# Patient Record
Sex: Female | Born: 1960 | Race: White | Hispanic: No | Smoking: Never smoker
Health system: Southern US, Community
[De-identification: ages and names within clinical notes are randomized; demographics above are authoritative.]

## PROBLEM LIST (undated history)

## (undated) DIAGNOSIS — C801 Malignant (primary) neoplasm, unspecified: Secondary | ICD-10-CM

## (undated) DIAGNOSIS — M810 Age-related osteoporosis without current pathological fracture: Secondary | ICD-10-CM

---

## 2014-11-10 ENCOUNTER — Other Ambulatory Visit: Payer: Self-pay

## 2014-11-10 DIAGNOSIS — Z1231 Encounter for screening mammogram for malignant neoplasm of breast: Secondary | ICD-10-CM

## 2014-12-06 ENCOUNTER — Ambulatory Visit: Payer: Self-pay

## 2014-12-09 ENCOUNTER — Encounter (INDEPENDENT_AMBULATORY_CARE_PROVIDER_SITE_OTHER): Payer: Self-pay

## 2014-12-09 ENCOUNTER — Ambulatory Visit
Admission: RE | Admit: 2014-12-09 | Discharge: 2014-12-09 | Disposition: A | Discharge status: Home / Self Care | Source: Ambulatory Visit

## 2014-12-09 DIAGNOSIS — Z1231 Encounter for screening mammogram for malignant neoplasm of breast: Secondary | ICD-10-CM

## 2014-12-13 ENCOUNTER — Other Ambulatory Visit: Payer: Self-pay | Admitting: *Deleted

## 2014-12-13 ENCOUNTER — Other Ambulatory Visit: Payer: Self-pay | Admitting: Pediatrics

## 2014-12-13 ENCOUNTER — Other Ambulatory Visit: Payer: Self-pay | Admitting: Obstetrics and Gynecology

## 2014-12-13 DIAGNOSIS — R928 Other abnormal and inconclusive findings on diagnostic imaging of breast: Secondary | ICD-10-CM

## 2014-12-21 ENCOUNTER — Encounter

## 2014-12-22 ENCOUNTER — Ambulatory Visit
Admission: RE | Admit: 2014-12-22 | Discharge: 2014-12-22 | Disposition: A | Discharge status: Home / Self Care | Source: Ambulatory Visit | Attending: Obstetrics and Gynecology | Admitting: Obstetrics and Gynecology

## 2014-12-22 ENCOUNTER — Other Ambulatory Visit: Payer: Self-pay | Admitting: Obstetrics and Gynecology

## 2014-12-22 DIAGNOSIS — R928 Other abnormal and inconclusive findings on diagnostic imaging of breast: Secondary | ICD-10-CM

## 2014-12-22 DIAGNOSIS — R921 Mammographic calcification found on diagnostic imaging of breast: Secondary | ICD-10-CM

## 2014-12-29 ENCOUNTER — Encounter

## 2014-12-29 ENCOUNTER — Ambulatory Visit
Admission: RE | Admit: 2014-12-29 | Discharge: 2014-12-29 | Disposition: A | Discharge status: Home / Self Care | Source: Ambulatory Visit | Attending: Obstetrics and Gynecology | Admitting: Obstetrics and Gynecology

## 2014-12-29 DIAGNOSIS — R921 Mammographic calcification found on diagnostic imaging of breast: Secondary | ICD-10-CM

## 2014-12-29 DIAGNOSIS — R928 Other abnormal and inconclusive findings on diagnostic imaging of breast: Secondary | ICD-10-CM

## 2015-01-04 ENCOUNTER — Inpatient Hospital Stay: Admission: RE | Admit: 2015-01-04 | Source: Ambulatory Visit

## 2015-07-31 ENCOUNTER — Emergency Department (HOSPITAL_COMMUNITY)

## 2015-07-31 ENCOUNTER — Encounter (HOSPITAL_COMMUNITY): Payer: Self-pay | Admitting: Emergency Medicine

## 2015-07-31 ENCOUNTER — Emergency Department (HOSPITAL_COMMUNITY)
Admission: EM | Admit: 2015-07-31 | Discharge: 2015-07-31 | Disposition: A | Discharge status: Home / Self Care | Attending: Emergency Medicine | Admitting: Emergency Medicine

## 2015-07-31 DIAGNOSIS — Y9289 Other specified places as the place of occurrence of the external cause: Secondary | ICD-10-CM | POA: Insufficient documentation

## 2015-07-31 DIAGNOSIS — S99921A Unspecified injury of right foot, initial encounter: Secondary | ICD-10-CM | POA: Diagnosis present

## 2015-07-31 DIAGNOSIS — W228XXA Striking against or struck by other objects, initial encounter: Secondary | ICD-10-CM | POA: Insufficient documentation

## 2015-07-31 DIAGNOSIS — Z85820 Personal history of malignant melanoma of skin: Secondary | ICD-10-CM | POA: Diagnosis not present

## 2015-07-31 DIAGNOSIS — Z8739 Personal history of other diseases of the musculoskeletal system and connective tissue: Secondary | ICD-10-CM | POA: Insufficient documentation

## 2015-07-31 DIAGNOSIS — R52 Pain, unspecified: Secondary | ICD-10-CM

## 2015-07-31 DIAGNOSIS — S92511A Displaced fracture of proximal phalanx of right lesser toe(s), initial encounter for closed fracture: Secondary | ICD-10-CM | POA: Diagnosis not present

## 2015-07-31 DIAGNOSIS — S92911A Unspecified fracture of right toe(s), initial encounter for closed fracture: Secondary | ICD-10-CM

## 2015-07-31 DIAGNOSIS — Y9389 Activity, other specified: Secondary | ICD-10-CM | POA: Insufficient documentation

## 2015-07-31 DIAGNOSIS — Y998 Other external cause status: Secondary | ICD-10-CM | POA: Insufficient documentation

## 2015-07-31 HISTORY — DX: Age-related osteoporosis without current pathological fracture: M81.0

## 2015-07-31 HISTORY — DX: Malignant (primary) neoplasm, unspecified: C80.1

## 2015-07-31 MED ORDER — LIDOCAINE HCL (PF) 2 % IJ SOLN
10.0000 mL | Freq: Once | INTRAMUSCULAR | Status: DC
  Filled 2015-07-31: qty 10

## 2015-07-31 MED ORDER — IBUPROFEN 800 MG PO TABS: 800.0000 mg | ORAL_TABLET | Freq: Three times a day (TID) | ORAL | Status: DC

## 2015-07-31 MED ORDER — HYDROCODONE-ACETAMINOPHEN 5-325 MG PO TABS: ORAL_TABLET | ORAL | Status: DC

## 2015-07-31 NOTE — Discharge Instructions (Signed)
Toe Fracture °A toe fracture is a break in one of the toe bones (phalanges). °HOME CARE °If You Have a Cast: °· Do not stick anything inside the cast to scratch your skin. °· Check the skin around the cast every day. Tell your doctor about any concerns. Do not put lotion on the skin underneath the cast. You may put lotion on dry skin around the edges of the cast. °· Do not put pressure on any part of the cast until it is fully hardened. This may take many hours. °· Keep the cast clean and dry. °Bathing °· Do not take baths, swim, or use a hot tub until your doctor says that you can. Ask your doctor if you can take showers. You may only be allowed to take sponge baths for bathing. °· If your doctor says that bathing and showering are okay, cover the cast or bandage (dressing) with a watertight plastic bag to protect it from water. Do not let the cast or bandage get wet. °Managing Pain, Stiffness, and Swelling °· If you do not have a cast, put ice on the injured area if told by your doctor: °¨ Put ice in a plastic bag. °¨ Place a towel between your skin and the bag. °¨ Leave the ice on for 20 minutes, 2-3 times per day. °· Move your toes often to avoid stiffness and to lessen swelling. °· Raise (elevate) the injured area above the level of your heart while you are sitting or lying down. °Driving °· Do not drive or use heavy machinery while taking pain medicine. °· Do not drive while wearing a cast on a foot that you use for driving. °Activity °· Return to your normal activities as told by your doctor. Ask your doctor what activities are safe for you. °· Perform exercises daily as told by your doctor or therapist. °Safety °· Do not use your leg to support your body weight until your doctor says that you can. Use crutches or other tools to help you move around as told by your doctor. °General Instructions °· If your toe was taped to a toe that is next to it (buddy taping), follow your doctor's instructions for changing  the gauze and tape. Change it more often: °¨ If the gauze and tape get wet. If this happens, dry the space between the toes. °¨ If the gauze and tape are too tight and they cause your toe to become pale or to lose feeling (numb). °· Wear a protective shoe as told by your doctor. If you were not given one, wear sturdy shoes that support your foot. Your shoes should not pinch your toes. Your shoes should not fit tightly against your toes. °· Do not use any tobacco products, including cigarettes, chewing tobacco, or e-cigarettes. Tobacco can delay bone healing. If you need help quitting, ask your doctor. °· Take medicines only as told by your doctor. °· Keep all follow-up visits as told by your doctor. This is important. °GET HELP IF: °· You have a fever. °· Your pain medicine is not helping. °· Your toe feels cold. °· You lose feeling (have numbness) in your toe. °· You still have pain after one week of rest and treatment. °· You still have pain after your doctor has said that you can start walking again. °· You have pain or tingling in your foot, and it is not going away. °· You have loss of feeling in your foot, and it is not going away. °GET   HELP RIGHT AWAY IF: °· You have severe pain. °· You have redness or swelling (inflammation) in your toe, and it is getting worse. °· You have pain or loss of feeling in your toe, and it is getting worse. °· Your toe is blue. °  °This information is not intended to replace advice given to you by your health care provider. Make sure you discuss any questions you have with your health care provider. °  °Document Released: 01/09/2008 Document Revised: 12/07/2014 Document Reviewed: 05/19/2014 °Elsevier Interactive Patient Education ©2016 [employer] ° °

## 2015-07-31 NOTE — ED Provider Notes (Signed)
CSN: [id number]     Arrival date & time 07/31/15  1357 History  By signing my name below, I, Jolayne Panther, attest that this documentation has been prepared under the direction and in the presence of Kaelan Amble, PA-C. Electronically Signed: Jolayne Panther, Scribe. 07/31/2015. 2:14 PM.    Chief Complaint  Patient presents with  . Toe Injury    The history is provided by the patient. No language interpreter was used.    HPI Comments: Desiree Brown is a 54 y.o. female who presents to the Emergency Department complaining of mild, non-radiating pain in her right fifth toe after stubbing it on a chair about an hour and a half ago.Pt reports hearing and feeling a pop during the incident. She is currently still ambulatory. Pt notes that she has tried icing it with little to no reif. She has no other complaints at this time.  Past Medical History  Diagnosis Date  . Cancer ([place])     melanoma  . Osteoporosis    History reviewed. No pertinent past surgical history. No family history on file. Social History  Substance Use Topics  . Smoking status: Never Smoker   . Smokeless tobacco: None  . Alcohol Use: No   OB History    No data available     Review of Systems  Musculoskeletal: Positive for myalgias and arthralgias.       Right fifth toe deformity  All other systems reviewed and are negative.  Allergies  Review of patient's allergies indicates no known allergies.  Home Medications   Prior to Admission medications   Not on File   BP 150/87 mmHg  Pulse 78  Temp(Src) 97.8 F (36.6 C) (Oral)  Resp 17  Ht 5\' 3"  (1.6 m)  Wt 115 lb (52.164 kg)  BMI 20.38 kg/m2  SpO2 100% Physical Exam  Constitutional: She is oriented to person, place, and time. She appears well-developed and well-nourished. No distress.  HENT:  Head: Normocephalic and atraumatic.  Eyes: Conjunctivae are normal.  Cardiovascular: Normal rate, regular rhythm and normal heart sounds.    Pulmonary/Chest: Effort normal and breath sounds normal.  Abdominal: She exhibits no distension.  Musculoskeletal:  Obvious deformity of the proximal right fifth toe No open wound  Sensation intact  No tenderness proximal to the toe  Neurological: She is alert and oriented to person, place, and time.  Skin: Skin is warm and dry.  Psychiatric: She has a normal mood and affect. Her behavior is normal. Judgment and thought content normal.  Nursing note and vitals reviewed.  ED Course  Reduction of dislocation Date/Time: 07/31/2015 2:30 PM Performed by: Kem Parkinson Authorized by: Kem Parkinson Consent: Verbal consent obtained. Risks and benefits: risks, benefits and alternatives were discussed Consent given by: patient Patient understanding: patient states understanding of the procedure being performed Imaging studies: imaging studies available Patient identity confirmed: verbally with patient and arm band Time out: Immediately prior to procedure a "time out" was called to verify the correct patient, procedure, equipment, support staff and site/side marked as required. Preparation: Patient was prepped and draped in the usual sterile fashion. Local anesthesia used: digital block preformed. Patient sedated: no Patient tolerance: Patient tolerated the procedure well with no immediate complications Comments: Anatomic alignment restored.      DIAGNOSTIC STUDIES:    Oxygen Saturation is 100% on RA, normal by my interpretation.   COORDINATION OF CARE:  2:06 PM Will order xray of pt's right fifth toe for review. Will numb  pt's toe before setting back into place. Discussed treatment plan with pt at bedside and pt agreed to plan.   Imaging Review Dg Toe 5th Right  07/31/2015  CLINICAL DATA:  Small toe injury today. Pain and swelling in the pinky toe. EXAM: RIGHT FIFTH TOE COMPARISON:  None. FINDINGS: There is an oblique mildly displaced fracture through the neck of the fifth  proximal phalanx. Intra-articular extension into the proximal interphalangeal joint is difficult to exclude, although this component appears nondisplaced. There is no dislocation. Mild soft tissue swelling is present within the small toe. IMPRESSION: Mildly displaced fracture of the fifth proximal phalangeal neck with possible distal intra-articular extension. Electronically Signed   By: Richardean Sale M.D.   On: 07/31/2015 14:40   I have personally reviewed and evaluated these images and lab results as part of my medical decision-making.     MDM   Final diagnoses:  Pain  Toe fracture, right, closed, initial encounter   Pt is ambulatory.  NV and NS intact,  Anatomical alignment restored after manipulation, but fx seems unstable.  Pt agrees to ortho f/u, post op shoe, buddy tape toes to maintain alignment  I personally performed the services described in this documentation, which was scribed in my presence. The recorded information has been reviewed and is accurate.    Kem Parkinson, PA-C 08/02/15 2148  Tanna Furry, MD 08/13/15 [phone]

## 2015-07-31 NOTE — ED Notes (Signed)
Pt reports stubbing RT pinky toe on a chair at 1230 this afternoon. Pt reports hearing a "pop." Pt ambulatory. Edema noted to toe.

## 2016-10-28 IMAGING — MG MM SCREENING BREAST TOMO BILATERAL
8 series · 9 of 24 positions shown · non-contrast
Comparison: Previous exam(s).

CLINICAL DATA: Screening.

EXAM:
DIGITAL SCREENING BILATERAL MAMMOGRAM WITH 3D TOMO WITH CAD

[L MLO]
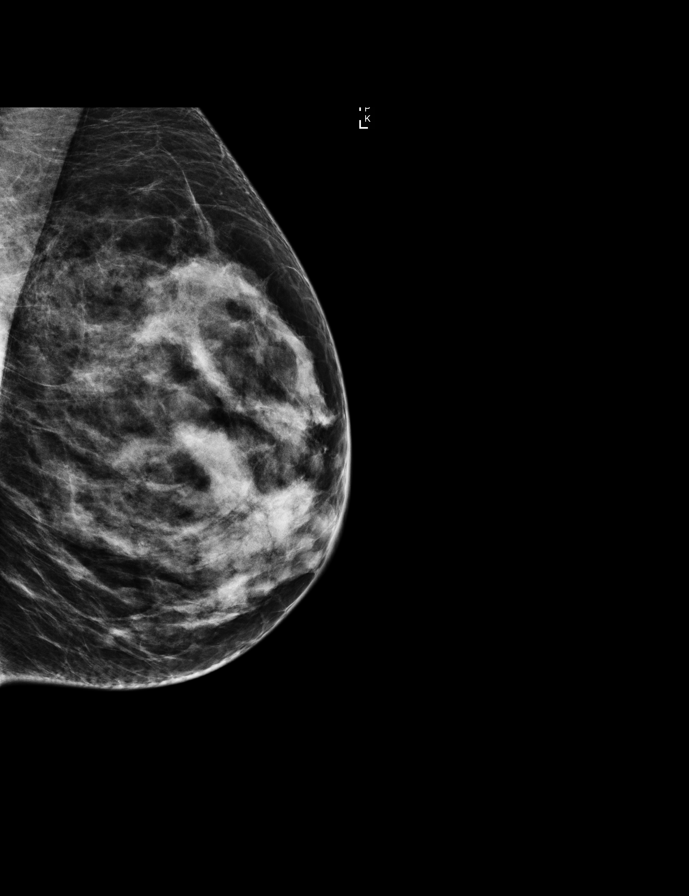

[R MLO]
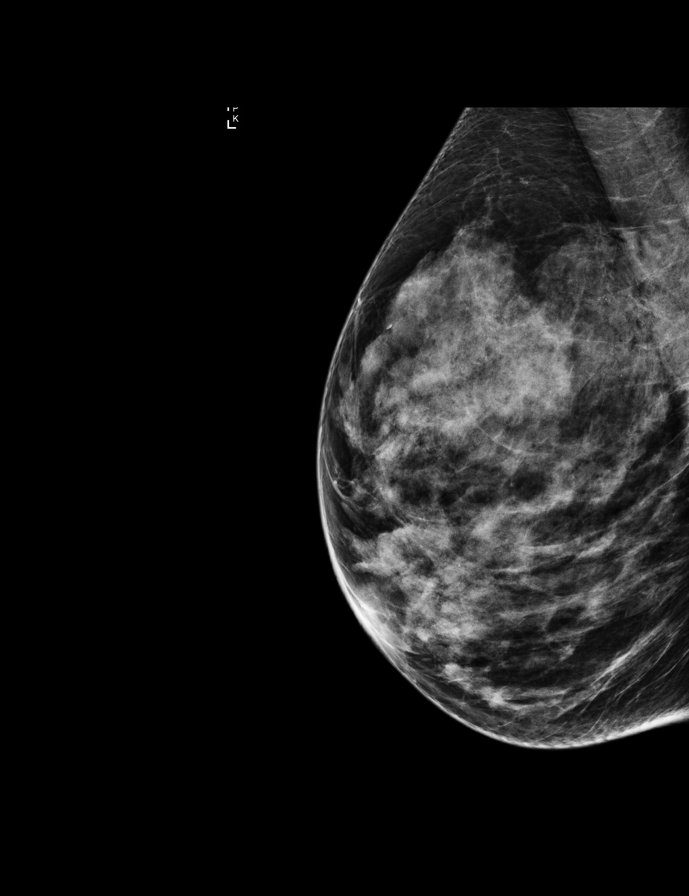

[L CC]
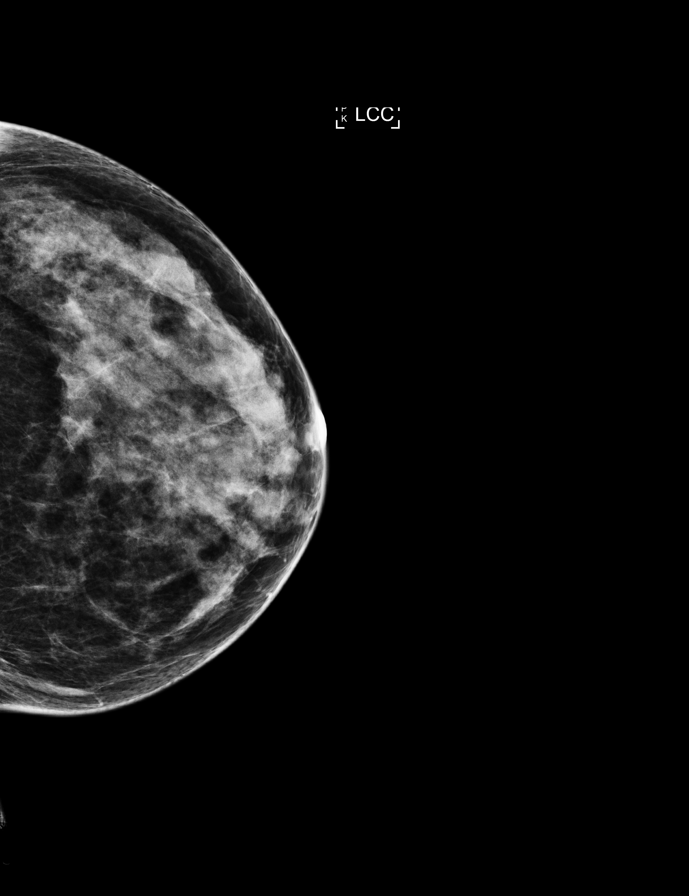

[R CC]
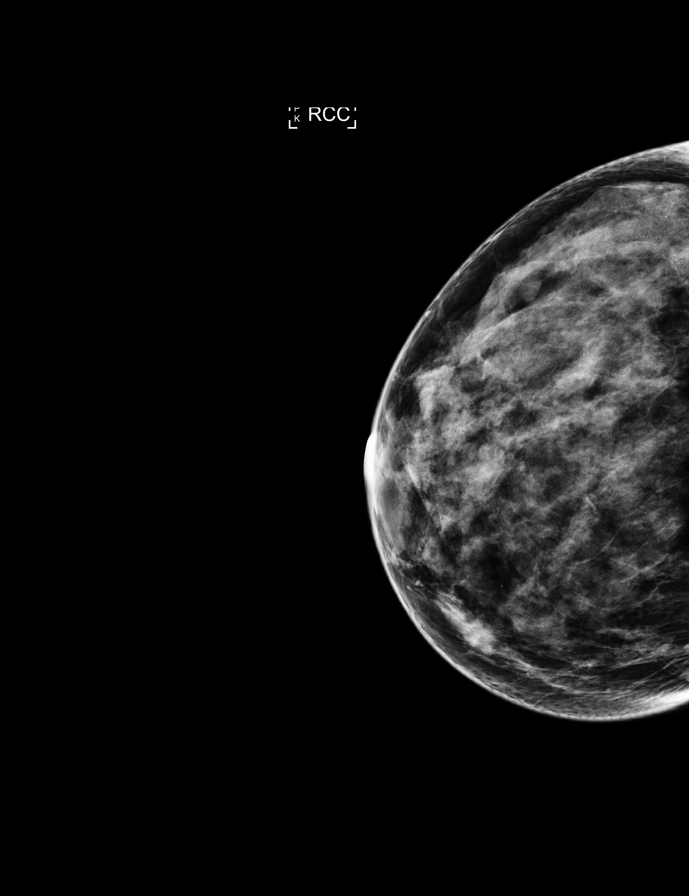

[R CC tomo · 2 of 53 frames shown]
[frame 18/53]
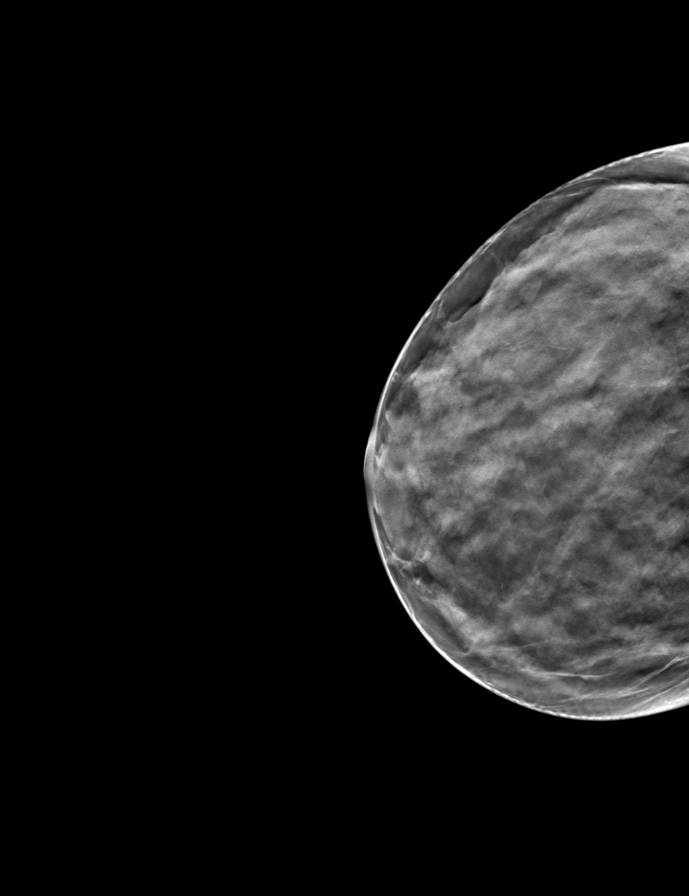
[frame 27/53]
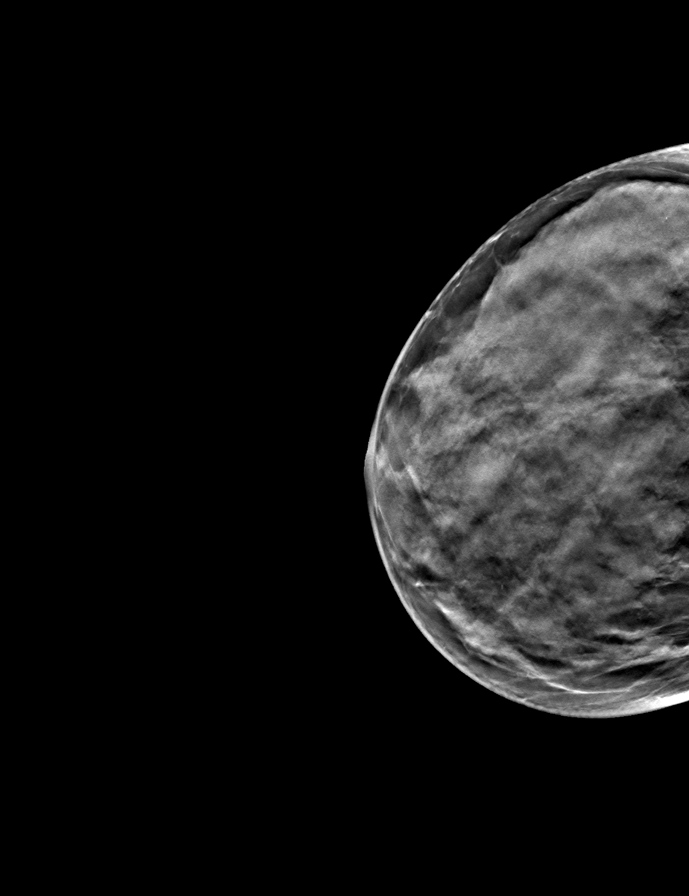

[R MLO tomo · tomo slice 23/46.0]
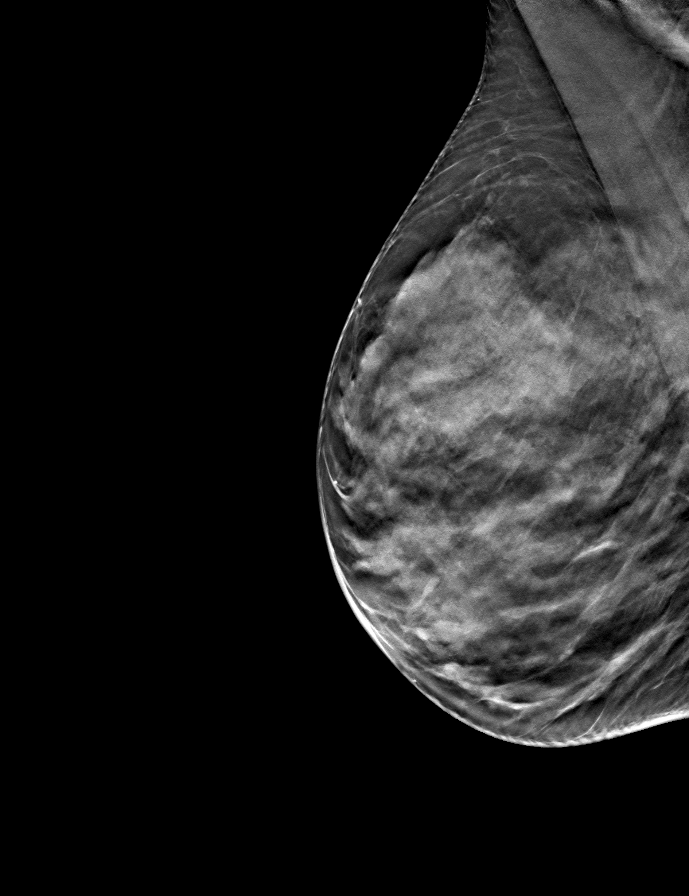

[L CC tomo · tomo slice 26/51.0]
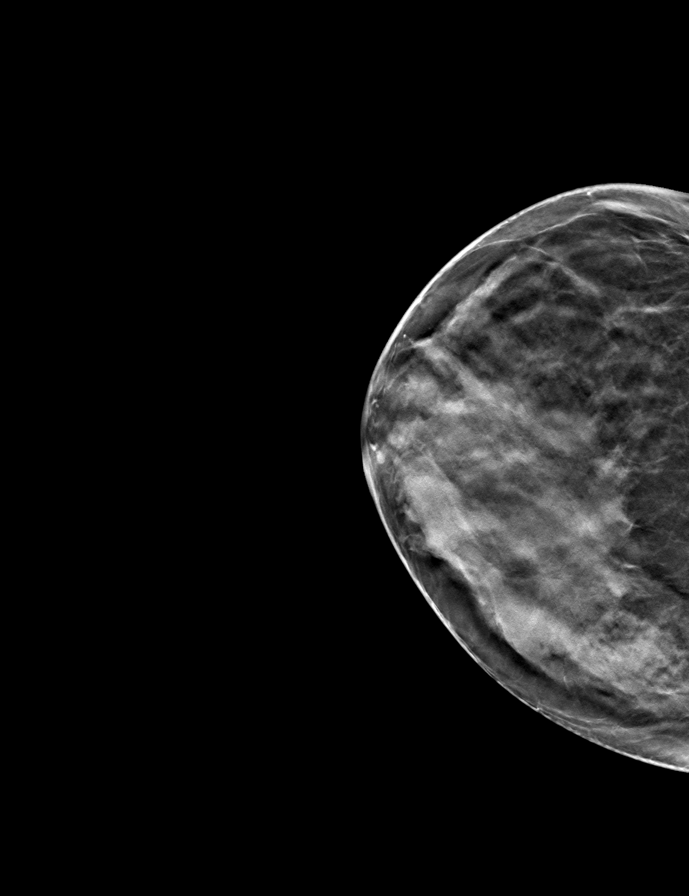

[L MLO tomo · tomo slice 25/50.0]
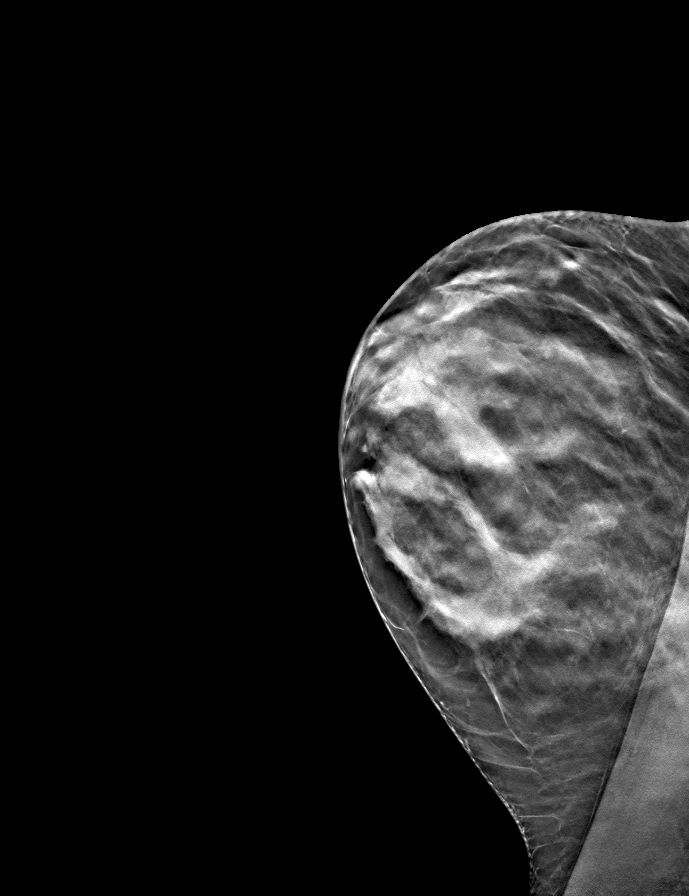

[9 of 24 positions shown; findings below may reference images not displayed]

ACR Breast Density Category d: The breast tissue is extremely dense,
which lowers the sensitivity of mammography.
FINDINGS: In the left breast, calcifications warrant further evaluation. In
the right breast, no findings suspicious for malignancy. Images were
processed with CAD.
IMPRESSION: Further evaluation is suggested for calcifications in the left
breast.

RECOMMENDATION:
Diagnostic mammogram of the left breast. (Code:[id number])

The patient will be contacted regarding the findings, and additional
imaging will be scheduled.

BI-RADS CATEGORY  0: Incomplete. Need additional imaging evaluation
and/or prior mammograms for comparison.

## 2018-11-04 ENCOUNTER — Institutional Professional Consult (permissible substitution): Admitting: Pulmonary Disease

## 2018-11-05 ENCOUNTER — Institutional Professional Consult (permissible substitution): Admitting: Pulmonary Disease

## 2019-12-07 ENCOUNTER — Institutional Professional Consult (permissible substitution): Admitting: Pulmonary Disease

## 2020-01-12 ENCOUNTER — Other Ambulatory Visit: Payer: Self-pay

## 2020-01-12 ENCOUNTER — Encounter: Payer: Self-pay | Admitting: Pulmonary Disease

## 2020-01-12 ENCOUNTER — Ambulatory Visit (INDEPENDENT_AMBULATORY_CARE_PROVIDER_SITE_OTHER): Admitting: Pulmonary Disease

## 2020-01-12 ENCOUNTER — Ambulatory Visit (INDEPENDENT_AMBULATORY_CARE_PROVIDER_SITE_OTHER)

## 2020-01-12 VITALS — BP 116/76 | HR 76 | Temp 97.8°F | Ht 62.0 in | Wt 112.0 lb

## 2020-01-12 DIAGNOSIS — R0609 Other forms of dyspnea: Secondary | ICD-10-CM

## 2020-01-12 DIAGNOSIS — R0600 Dyspnea, unspecified: Secondary | ICD-10-CM

## 2020-01-12 MED ORDER — ALBUTEROL SULFATE HFA 108 (90 BASE) MCG/ACT IN AERS: 2.0000 | INHALATION_SPRAY | Freq: Four times a day (QID) | RESPIRATORY_TRACT | 3 refills | Status: AC | PRN

## 2020-01-12 NOTE — Progress Notes (Signed)
Desiree Brown    [id number]    1961/06/11  Primary Care Physician:No primary care provider on file.  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 Calcium-Vitamin D-Vitamin K (CALCIUM SOFT CHEWS PO) Take by mouth.  . [DISCONTINUED] HYDROcodone-acetaminophen (NORCO/VICODIN) 5-325 MG tablet Take one tab po q 4-6 hrs prn pain  . [DISCONTINUED] ibuprofen (ADVIL,MOTRIN) 800 MG tablet Take 1 tablet (800 mg total) by mouth 3 (three) times daily.   No facility-administered encounter medications on file as of 01/12/2020.    Allergies as of 01/12/2020  . (No Known Allergies)    Past Medical History:  Diagnosis Date  . Cancer ([place])    melanoma  . Osteoporosis     No past surgical history on file.  No family history on file.  Social History   Socioeconomic History  . Marital status: Widowed    Spouse name: Not on file  . Number of children: Not on file  . Years of education: Not on file  . Highest education level: Not on file  Occupational History  . Not on file  Tobacco Use  . Smoking status: Never Smoker  . Smokeless tobacco: Never Used  Substance and Sexual Activity  . Alcohol use: No  . Drug use: No  . Sexual activity: Not on file  Other Topics Concern  . Not on file  Social History Narrative  . Not on file   Social Determinants of Health   Financial Resource Strain:   .  Difficulty of Paying Living Expenses:   Food Insecurity:   . Worried About Charity fundraiser in the Last Year:   . Arboriculturist in the Last Year:   Transportation Needs:   . Film/video editor (Medical):   Marland Kitchen Lack of Transportation (Non-Medical):   Physical Activity:   . Days of Exercise per Week:   . Minutes of Exercise per Session:   Stress:   . Feeling of Stress :   Social Connections:   . Frequency of Communication with Friends and Family:   . Frequency of Social Gatherings with Friends and Family:   . Attends Religious Services:   . Active Member of Clubs or Organizations:   . Attends Archivist Meetings:   Marland Kitchen Marital Status:   Intimate Partner Violence:   . Fear of Current or Ex-Partner:   . Emotionally Abused:   Marland Kitchen Physically Abused:   . Sexually Abused:     Review of Systems  Respiratory: Positive for shortness of breath.     Vitals:   01/12/20 1623  BP: 116/76  Pulse: 76  Temp: 97.8 F (36.6 C)  SpO2: 98%     Physical Exam  Constitutional: She appears well-developed and well-nourished.  HENT:  Head: Normocephalic and atraumatic.  Eyes: Pupils are equal, round, and reactive to light. Right eye exhibits no discharge.  Left eye exhibits no discharge.  Neck: No tracheal deviation present. No thyromegaly present.  Cardiovascular: Normal rate and regular rhythm.  Pulmonary/Chest: Effort normal and breath sounds normal. No respiratory distress. She has no wheezes. She has no rales. She exhibits no tenderness.  Psychiatric: She has a normal mood and affect.    Data Reviewed: Chest x-ray February 2020-no active problems noted on a chest x-ray reviewed by myself  Assessment:  Shortness of breath on exertion  No history of lung disease or heart disease  Plan/Recommendations: Obtain pulmonary function test  Obtain chest x-ray  Echocardiogram for dyspnea on exertion  Albuterol use pre activity   Sherrilyn Rist MD [facility] 01/12/2020, 4:57 PM  CC: Marquis Buggy, FNP

## 2020-01-12 NOTE — Patient Instructions (Signed)
Albuterol as needed  We will get chest x-ray, breathing study-PFT, echocardiogram to check your heart  All above may be normal Will reassure that you do not have any ongoing problems you need to be concerned about  Continue activity as tolerated

## 2020-01-15 ENCOUNTER — Telehealth: Payer: Self-pay | Admitting: Pulmonary Disease

## 2020-01-15 NOTE — Telephone Encounter (Signed)
Dr. Ander Slade just an [place] this patient has decided that she no longer wants to have the ECHO done. I called and verified this with the patient. Order has been canceled. Nothing further needed at this time.

## 2020-02-16 ENCOUNTER — Other Ambulatory Visit (HOSPITAL_COMMUNITY)

## 2020-02-19 ENCOUNTER — Encounter

## 2021-01-11 ENCOUNTER — Other Ambulatory Visit: Payer: Self-pay | Admitting: Pulmonary Disease

## 2021-01-11 DIAGNOSIS — R0609 Other forms of dyspnea: Secondary | ICD-10-CM
